# Patient Record
Sex: Female | Born: 1965 | Race: White | Hispanic: No | Marital: Married | Smoking: Former smoker
Health system: Southern US, Community
[De-identification: ages and names within clinical notes are randomized; demographics above are authoritative.]

## PROBLEM LIST (undated history)

## (undated) DIAGNOSIS — I10 Essential (primary) hypertension: Secondary | ICD-10-CM

## (undated) DIAGNOSIS — I4891 Unspecified atrial fibrillation: Secondary | ICD-10-CM

## (undated) HISTORY — PX: TONSILLECTOMY: SUR1361

---

## 2004-01-30 ENCOUNTER — Ambulatory Visit: Payer: Self-pay | Admitting: Unknown Physician Specialty

## 2006-08-18 ENCOUNTER — Ambulatory Visit: Payer: Self-pay | Admitting: Unknown Physician Specialty

## 2007-02-17 ENCOUNTER — Ambulatory Visit: Payer: Self-pay | Admitting: Psychiatry

## 2007-02-28 ENCOUNTER — Ambulatory Visit: Payer: Self-pay | Admitting: Psychiatry

## 2007-03-09 ENCOUNTER — Ambulatory Visit: Payer: Self-pay | Admitting: Psychiatry

## 2007-03-29 ENCOUNTER — Ambulatory Visit: Payer: Self-pay | Admitting: Psychiatry

## 2007-04-19 ENCOUNTER — Ambulatory Visit: Payer: Self-pay | Admitting: Gastroenterology

## 2007-06-09 ENCOUNTER — Ambulatory Visit: Payer: Self-pay | Admitting: General Practice

## 2007-09-27 ENCOUNTER — Ambulatory Visit: Payer: Self-pay | Admitting: Unknown Physician Specialty

## 2008-10-03 ENCOUNTER — Ambulatory Visit: Payer: Self-pay | Admitting: Unknown Physician Specialty

## 2013-05-26 ENCOUNTER — Ambulatory Visit: Payer: Self-pay

## 2013-05-26 LAB — RAPID STREP-A WITH REFLX: Micro Text Report: NEGATIVE

## 2013-05-29 LAB — BETA STREP CULTURE(ARMC)

## 2013-11-03 ENCOUNTER — Ambulatory Visit: Payer: Self-pay | Admitting: Gastroenterology

## 2014-05-03 ENCOUNTER — Ambulatory Visit: Payer: Self-pay | Admitting: Internal Medicine

## 2014-10-25 ENCOUNTER — Other Ambulatory Visit: Payer: Self-pay | Admitting: Internal Medicine

## 2014-10-25 DIAGNOSIS — R748 Abnormal levels of other serum enzymes: Secondary | ICD-10-CM

## 2014-10-29 ENCOUNTER — Ambulatory Visit
Admission: RE | Admit: 2014-10-29 | Discharge: 2014-10-29 | Disposition: A | Discharge status: Home / Self Care | Payer: BC Managed Care – PPO | Source: Ambulatory Visit | Attending: Internal Medicine | Admitting: Internal Medicine

## 2014-10-29 DIAGNOSIS — R748 Abnormal levels of other serum enzymes: Secondary | ICD-10-CM | POA: Insufficient documentation

## 2015-07-03 ENCOUNTER — Other Ambulatory Visit: Payer: Self-pay | Admitting: Internal Medicine

## 2015-07-03 DIAGNOSIS — R748 Abnormal levels of other serum enzymes: Secondary | ICD-10-CM

## 2015-07-03 DIAGNOSIS — R17 Unspecified jaundice: Secondary | ICD-10-CM

## 2015-07-04 ENCOUNTER — Other Ambulatory Visit: Payer: Self-pay | Admitting: Internal Medicine

## 2015-07-04 ENCOUNTER — Ambulatory Visit
Admission: RE | Admit: 2015-07-04 | Discharge: 2015-07-04 | Disposition: A | Discharge status: Home / Self Care | Payer: BC Managed Care – PPO | Source: Ambulatory Visit | Attending: Internal Medicine | Admitting: Internal Medicine

## 2015-07-04 DIAGNOSIS — R748 Abnormal levels of other serum enzymes: Secondary | ICD-10-CM | POA: Diagnosis not present

## 2015-07-04 DIAGNOSIS — R17 Unspecified jaundice: Secondary | ICD-10-CM

## 2015-07-11 ENCOUNTER — Ambulatory Visit: Payer: BC Managed Care – PPO

## 2015-07-18 ENCOUNTER — Inpatient Hospital Stay: Payer: BC Managed Care – PPO

## 2015-07-18 ENCOUNTER — Encounter: Payer: Self-pay | Admitting: *Deleted

## 2015-07-18 ENCOUNTER — Inpatient Hospital Stay
Admission: AD | Admit: 2015-07-18 | Discharge: 2015-07-19 | DRG: 441 | Disposition: A | Discharge status: Home / Self Care | Payer: BC Managed Care – PPO | Source: Ambulatory Visit | Attending: Internal Medicine | Admitting: Internal Medicine

## 2015-07-18 DIAGNOSIS — Z6820 Body mass index (BMI) 20.0-20.9, adult: Secondary | ICD-10-CM

## 2015-07-18 DIAGNOSIS — R109 Unspecified abdominal pain: Secondary | ICD-10-CM | POA: Diagnosis present

## 2015-07-18 DIAGNOSIS — E785 Hyperlipidemia, unspecified: Secondary | ICD-10-CM | POA: Diagnosis present

## 2015-07-18 DIAGNOSIS — E43 Unspecified severe protein-calorie malnutrition: Secondary | ICD-10-CM | POA: Insufficient documentation

## 2015-07-18 DIAGNOSIS — Z91048 Other nonmedicinal substance allergy status: Secondary | ICD-10-CM

## 2015-07-18 DIAGNOSIS — K7581 Nonalcoholic steatohepatitis (NASH): Principal | ICD-10-CM | POA: Diagnosis present

## 2015-07-18 DIAGNOSIS — Z7982 Long term (current) use of aspirin: Secondary | ICD-10-CM | POA: Diagnosis not present

## 2015-07-18 DIAGNOSIS — R627 Adult failure to thrive: Secondary | ICD-10-CM | POA: Diagnosis present

## 2015-07-18 DIAGNOSIS — R7989 Other specified abnormal findings of blood chemistry: Secondary | ICD-10-CM

## 2015-07-18 DIAGNOSIS — Z8249 Family history of ischemic heart disease and other diseases of the circulatory system: Secondary | ICD-10-CM

## 2015-07-18 DIAGNOSIS — R945 Abnormal results of liver function studies: Secondary | ICD-10-CM

## 2015-07-18 DIAGNOSIS — Z87891 Personal history of nicotine dependence: Secondary | ICD-10-CM

## 2015-07-18 DIAGNOSIS — K219 Gastro-esophageal reflux disease without esophagitis: Secondary | ICD-10-CM | POA: Diagnosis present

## 2015-07-18 DIAGNOSIS — I10 Essential (primary) hypertension: Secondary | ICD-10-CM | POA: Diagnosis present

## 2015-07-18 DIAGNOSIS — I480 Paroxysmal atrial fibrillation: Secondary | ICD-10-CM | POA: Diagnosis present

## 2015-07-18 DIAGNOSIS — R112 Nausea with vomiting, unspecified: Secondary | ICD-10-CM | POA: Diagnosis present

## 2015-07-18 HISTORY — DX: Unspecified atrial fibrillation: I48.91

## 2015-07-18 HISTORY — DX: Essential (primary) hypertension: I10

## 2015-07-18 LAB — CBC WITH DIFFERENTIAL/PLATELET
BASOS PCT: 2 %
Basophils Absolute: 0.2 10*3/uL — ABNORMAL HIGH (ref 0–0.1)
EOS ABS: 0 10*3/uL (ref 0–0.7)
Eosinophils Relative: 0 %
HCT: 34.4 % — ABNORMAL LOW (ref 35.0–47.0)
HEMOGLOBIN: 12 g/dL (ref 12.0–16.0)
Lymphocytes Relative: 9 %
Lymphs Abs: 1.3 10*3/uL (ref 1.0–3.6)
MCH: 37.4 pg — ABNORMAL HIGH (ref 26.0–34.0)
MCHC: 34.8 g/dL (ref 32.0–36.0)
MCV: 107.6 fL — ABNORMAL HIGH (ref 80.0–100.0)
Monocytes Absolute: 0.8 10*3/uL (ref 0.2–0.9)
Monocytes Relative: 6 %
NEUTROS PCT: 83 %
Neutro Abs: 11.9 10*3/uL — ABNORMAL HIGH (ref 1.4–6.5)
Platelets: 272 10*3/uL (ref 150–440)
RBC: 3.19 MIL/uL — AB (ref 3.80–5.20)
RDW: 14 % (ref 11.5–14.5)
WBC: 14.2 10*3/uL — AB (ref 3.6–11.0)

## 2015-07-18 LAB — LIPASE, BLOOD: Lipase: 36 U/L (ref 11–51)

## 2015-07-18 LAB — COMPREHENSIVE METABOLIC PANEL
ALBUMIN: 2.4 g/dL — AB (ref 3.5–5.0)
ALK PHOS: 356 U/L — AB (ref 38–126)
ALT: 70 U/L — AB (ref 14–54)
AST: 212 U/L — ABNORMAL HIGH (ref 15–41)
Anion gap: 11 (ref 5–15)
BUN: <5 mg/dL — ABNORMAL LOW (ref 6–20)
CALCIUM: 8.1 mg/dL — AB (ref 8.9–10.3)
CO2: 35 mmol/L — AB (ref 22–32)
CREATININE: 0.73 mg/dL (ref 0.44–1.00)
Chloride: 89 mmol/L — ABNORMAL LOW (ref 101–111)
GFR calc Af Amer: >60 mL/min (ref >60)
GFR calc non Af Amer: >60 mL/min (ref >60)
GLUCOSE: 99 mg/dL (ref 65–99)
Potassium: 2.8 mmol/L — CL (ref 3.5–5.1)
SODIUM: 135 mmol/L (ref 135–145)
Total Bilirubin: 4.9 mg/dL — ABNORMAL HIGH (ref 0.3–1.2)
Total Protein: 5.9 g/dL — ABNORMAL LOW (ref 6.5–8.1)

## 2015-07-18 LAB — POTASSIUM: POTASSIUM: 2.5 mmol/L — AB (ref 3.5–5.1)

## 2015-07-18 LAB — FERRITIN: FERRITIN: 555 ng/mL — AB (ref 11–307)

## 2015-07-18 LAB — AMYLASE: Amylase: 48 U/L (ref 28–100)

## 2015-07-18 LAB — MAGNESIUM: MAGNESIUM: 1.5 mg/dL — AB (ref 1.7–2.4)

## 2015-07-18 MED ORDER — ENOXAPARIN SODIUM 40 MG/0.4ML ~~LOC~~ SOLN
40.0000 mg | SUBCUTANEOUS | Status: DC
  Administered 2015-07-18: 40 mg via SUBCUTANEOUS
  Filled 2015-07-18: qty 0.4

## 2015-07-18 MED ORDER — PANTOPRAZOLE SODIUM 40 MG IV SOLR
40.0000 mg | Freq: Every day | INTRAVENOUS | Status: DC
  Administered 2015-07-18 – 2015-07-19 (×2): 40 mg via INTRAVENOUS
  Filled 2015-07-18 (×2): qty 40

## 2015-07-18 MED ORDER — POTASSIUM CHLORIDE 10 MEQ/100ML IV SOLN
10.0000 meq | INTRAVENOUS | Status: DC
  Administered 2015-07-18: 10 meq via INTRAVENOUS
  Filled 2015-07-18 (×4): qty 100

## 2015-07-18 MED ORDER — MAGNESIUM SULFATE 4 GM/100ML IV SOLN
4.0000 g | Freq: Once | INTRAVENOUS | Status: AC
  Administered 2015-07-18: 4 g via INTRAVENOUS
  Filled 2015-07-18: qty 100

## 2015-07-18 MED ORDER — ONDANSETRON HCL 4 MG/2ML IJ SOLN
4.0000 mg | Freq: Four times a day (QID) | INTRAMUSCULAR | Status: DC | PRN
  Administered 2015-07-18: 4 mg via INTRAVENOUS
  Filled 2015-07-18: qty 2

## 2015-07-18 MED ORDER — ONDANSETRON HCL 4 MG PO TABS: 4.0000 mg | ORAL_TABLET | Freq: Four times a day (QID) | ORAL | Status: DC | PRN

## 2015-07-18 MED ORDER — DIPHENHYDRAMINE HCL 25 MG PO CAPS
25.0000 mg | ORAL_CAPSULE | Freq: Once | ORAL | Status: AC
  Administered 2015-07-18: 22:00:00 25 mg via ORAL
  Filled 2015-07-18: qty 1

## 2015-07-18 MED ORDER — POTASSIUM CHLORIDE CRYS ER 20 MEQ PO TBCR
40.0000 meq | EXTENDED_RELEASE_TABLET | ORAL | Status: AC
  Administered 2015-07-18 – 2015-07-19 (×2): 40 meq via ORAL
  Filled 2015-07-18 (×2): qty 2

## 2015-07-18 MED ORDER — POTASSIUM CHLORIDE CRYS ER 20 MEQ PO TBCR
40.0000 meq | EXTENDED_RELEASE_TABLET | Freq: Once | ORAL | Status: AC
  Administered 2015-07-18: 20:00:00 40 meq via ORAL
  Filled 2015-07-18: qty 2

## 2015-07-18 MED ORDER — KCL IN DEXTROSE-NACL 20-5-0.9 MEQ/L-%-% IV SOLN
INTRAVENOUS | Status: AC
  Administered 2015-07-18 – 2015-07-19 (×2): via INTRAVENOUS
  Filled 2015-07-18 (×2): qty 1000

## 2015-07-18 MED ORDER — METOPROLOL TARTRATE 1 MG/ML IV SOLN: 5.0000 mg | INTRAVENOUS | Status: DC | PRN

## 2015-07-18 MED ORDER — GADOBENATE DIMEGLUMINE 529 MG/ML IV SOLN
15.0000 mL | Freq: Once | INTRAVENOUS | Status: AC | PRN
  Administered 2015-07-18: 20:00:00 15 mL via INTRAVENOUS

## 2015-07-18 MED ORDER — MORPHINE SULFATE (PF) 2 MG/ML IV SOLN
2.0000 mg | INTRAVENOUS | Status: DC | PRN
  Administered 2015-07-18 (×2): 2 mg via INTRAVENOUS
  Filled 2015-07-18 (×2): qty 1

## 2015-07-18 NOTE — Consult Note (Signed)
  Pt seen and examined. Please see Monique Mcfarland' notes. Pt with jaundice, N/V/D, and some abdominal pain. U/S neg. No obvious risk factors. MRCP ordered to make sure there is no biliary obstruction. If MRCP positive, then ERCP tomorrow. Otherwise, will check for hepatocellular process. Labs ordered. Will follow. Thanks.

## 2015-07-18 NOTE — Progress Notes (Signed)
Dr. Amado Coegouru notified of critical potassium at 2.8. Telephone

## 2015-07-18 NOTE — Consult Note (Signed)
GI Inpatient Consult Note  Reason for Consult:   Attending Requesting Consult:  History of Present Illness: Monique Mcfarland is a 50 y.o. female seen for evaluation of abnormal LFTs at the request of Dr. Amado CoeGouru.  She reports developing nausea, vomiting, weigh loss early Feb 2017 when caring for her terminally ill mother who passed in March. She reports post prandial nausea, vomiting, and epigastric pain. Symptoms can occur 15 min-2 hours after meals. Usually having vomiting of non digested foods and bile. After vomiting the epigastric pain and nausea will typically resolve. She takes omeprazole 20mg  daily which controls her GERD symptoms fairly well. She denies any dysphagia. BM are QOD without gross blood or melena. Weight down approximately 25lbs in last 8 weeks. Family history of gallstones. Nsaid use less than 1x/week. No lower abd pain.   Last Colonoscopy: 10/2013-normal.  Last Endoscopy: None prior    Past Medical History:  Past Medical History  Diagnosis Date  . Hypertension   . Atrial fibrillation [facility](HCC)     Problem List: Patient Active Problem List   Diagnosis Date Noted  . Acute abdominal pain 07/18/2015    Past Surgical History: Past Surgical History  Procedure Laterality Date  . Tonsillectomy      Allergies: Allergies  Allergen Reactions  . Pollen Extract Other (See Comments)    Swelling of eyes, running nose, stretchy throat, pet dandruff    Home Medications: Prescriptions prior to admission  Medication Sig Dispense Refill Last Dose  . metoprolol tartrate (LOPRESSOR) 25 MG tablet Take 25 mg by mouth daily.     . Multiple Vitamin (MULTIVITAMIN) tablet Take 1 tablet by mouth daily.     Marland Kitchen. omeprazole (PRILOSEC) 10 MG capsule Take 10 mg by mouth daily.      Home medication reconciliation was completed with the patient.   Scheduled Inpatient Medications:   . enoxaparin (LOVENOX) injection  40 mg Subcutaneous Q24H    Continuous Inpatient Infusions:   .  dextrose 5 % and 0.9 % NaCl with KCl 20 mEq/L      PRN Inpatient Medications:  metoprolol, morphine injection, ondansetron **OR** ondansetron (ZOFRAN) IV  Family History: family history includes Atrial fibrillation in her father; Cancer in her maternal grandfather, maternal grandmother, and mother; Heart attack in her father and paternal grandmother; Hypertension in her father.  Gallstones-mother.  Social History:   reports that she quit smoking about 16 months ago. Her smoking use included Cigarettes. She has a 7.5 pack-year smoking history. She does not have any smokeless tobacco history on file. She reports that she drinks about 0.6 oz of alcohol per week. She reports that she does not use illicit drugs.   2-3 alcohol drinks per week. No illicit drug use.   Review of Systems: Constitutional: + weight loss.  Eyes: No changes in vision. ENT: No oral lesions, sore throat.  GI: see HPI.  Heme/Lymph: No easy bruising.  CV: No chest pain.  GU: No hematuria.  Integumentary: No rashes.  Neuro: No headaches.  Psych: No depression/anxiety.  Endocrine: No heat/cold intolerance.  Allergic/Immunologic: No urticaria.  Resp: No cough, SOB.  Musculoskeletal: No joint swelling.    Physical Examination: BP 121/77 mmHg  Pulse 92  Temp(Src) 98 F (36.7 C) (Oral)  Resp 18  Ht 5\' 9"  (1.753 m)  Wt 137 lb 6.4 oz (62.324 kg)  BMI 20.28 kg/m2  SpO2 100% Gen: NAD, alert and oriented x 4 HEENT: PEERLA, EOMI, Neck: supple, no JVD or thyromegaly Chest: CTA bilaterally,  no wheezes, crackles, or other adventitious sounds CV: RRR, no m/g/c/r Abd: soft, NT, ND, +BS in all four quadrants; no HSM, guarding, ridigity, or rebound tenderness Ext: no edema, well perfused with 2+ pulses, Skin: no rash or lesions noted Lymph: no LAD  Data: Lab Results  Component Value Date   WBC 14.2* 07/18/2015   HGB 12.0 07/18/2015   HCT 34.4* 07/18/2015   MCV 107.6* 07/18/2015   PLT 272 07/18/2015    Recent  Labs Lab 07/18/15 1410  HGB 12.0   No results found for: NA, K, CL, CO2, BUN, CREATININE, GLU No results found for: ALT, AST, GGT, ALKPHOS, BILITOT No results for input(s): APTT, INR, PTT in the last 168 hours.   SU IMPRESSION: Increased liver echogenicity, a finding most likely indicative of hepatic steatosis. While no focal liver lesions are identified, it must be cautioned that the sensitivity of ultrasound for focal liver lesions is diminished in this circumstance. Study otherwise Unremarkable.   Assessment/Plan: [name] is a 50 y.o. female admitted for n/v, abdominal pain, abnormal LFTS, weight loss.    1. Abnormal LFTs/n/v/abd pain-minimally elevated 03/2014, transaminases have been significantly elevated since 09/2014, T bili began elevating in 06/2015. Negative viral serologies. Needs MRCP to rule out obstructive process although none seen on [place]. Will check labs for autoimmune hepatitis, Wilson's, hemachromatosis, etc. If MRCP positive will need ERCP.     Recommendations:  1. MRCP 2. Full serologic work up.  3. Clear liquid diet after MRPC, NPO at midnight for any possible procedures tomorrow.   *Case discussed w/ Dr. Bluford Kaufmann.   Thank you for the consult. Please call with questions or concerns.  Bluford Kaufmann, PA-C [facility] GI

## 2015-07-18 NOTE — H&P (Signed)
[facility] - [place] at [facility]   PATIENT NAME: Monique Mcfarland    MR#:  [id number]  DATE OF BIRTH:  October 28, 1965  DATE OF ADMISSION:  07/18/2015  PRIMARY CARE PHYSICIAN: Curtis Sites III, MD   REQUESTING/REFERRING PHYSICIAN: Daniel Nones  CHIEF COMPLAINT:  Abdominal pain, nausea, vomiting and weight loss  HISTORY OF PRESENT ILLNESS:  Monique Mcfarland  is a 50 y.o. female with a known history of essential hypertension, hyperlipidemia and paroxysmal atrial fibrillation on aspirin is presenting as a direct admit from primary care physician office with a chief complaint of intractable nausea and vomiting and lower abdominal discomfort associated with 25 pounds of weight loss in the past 6-8 weeks. Patient was seen by her primary care physician few weeks ago and as her LFTs are elevated right upper quadrant ultrasound was performed on 4/6 in which has revealed hepatic steatosis at that time his bilirubin was elevated at 3.8 and repeat her LFTs has revealed bilirubin at file as reported by the primary care physician. In the interim patient became nauseous and has been persistently vomiting for the past few days and reporting 25 pounds of weight loss in the past 6-8 weeks. PCP is concerned and sent her here as a direct admit for further evaluation by gastroenterology. During my examination patient is still complaining of nausea and vomiting with the lower abdominal discomfort associated with weakness. Denies any dizziness or loss of consciousness. Reports that her hepatitis panel is normal  PAST MEDICAL HISTORY:   Past Medical History  Diagnosis Date  . Hypertension   . Atrial fibrillation (HCC)    hyperlipidemia  PAST SURGICAL HISTOIRY:   Past Surgical History  Procedure Laterality Date  . Tonsillectomy      SOCIAL HISTORY:   Social History  Substance Use Topics  . Smoking status: Former Smoker -- 0.50 packs/day for 15 years    Types: Cigarettes    Quit  date: 02/22/2014  . Smokeless tobacco: Not on file  . Alcohol Use: 0.6 oz/week    1 Shots of liquor per week   lives with her partner and their son-  FAMILY HISTORY:   Family History  Problem Relation Age of Onset  . Cancer Mother   . Hypertension Father   . Atrial fibrillation Father   . Heart attack Father   . Cancer Maternal Grandmother   . Cancer Maternal Grandfather   . Heart attack Paternal Grandmother   Mom deceased with the static lung cancer  DRUG ALLERGIES:   Allergies  Allergen Reactions  . Pollen Extract Other (See Comments)    Swelling of eyes, running nose, stretchy throat, pet dandruff    REVIEW OF SYSTEMS:  CONSTITUTIONAL: No fever, fatigue or weakness.  EYES: No blurred or double vision.  EARS, NOSE, AND THROAT: No tinnitus or ear pain.  RESPIRATORY: No cough, shortness of breath, wheezing or hemoptysis.  CARDIOVASCULAR: No chest pain, orthopnea, edema.  GASTROINTESTINAL: Reporting  nausea, vomiting,denies  diarrhea , reporting lower abdominal discomfort with some pain  GENITOURINARY: No dysuria, hematuria.  ENDOCRINE: No polyuria, nocturia,  HEMATOLOGY: No anemia, easy bruising or bleeding SKIN: No rash or lesion. MUSCULOSKELETAL: No joint pain or arthritis.   NEUROLOGIC: No tingling, numbness, weakness.  PSYCHIATRY: No anxiety or depression.   MEDICATIONS AT HOME:   Prior to Admission medications   Medication Sig Start Date End Date Taking? Authorizing Provider  metoprolol tartrate (LOPRESSOR) 25 MG tablet Take 25 mg by mouth daily.   Yes  Historical Provider, MD  Multiple Vitamin (MULTIVITAMIN) tablet Take 1 tablet by mouth daily.   Yes Historical Provider, MD  omeprazole (PRILOSEC) 10 MG capsule Take 10 mg by mouth daily.   Yes Historical Provider, MD      VITAL SIGNS:  Blood pressure 121/77, pulse 92, temperature 98 F (36.7 C), temperature source Oral, resp. rate 18, height  (1.753 m), weight 62.324 kg (137 lb 6.4 oz), SpO2 100  %.  PHYSICAL EXAMINATION:  GENERAL:  50 y.o.-year-old patient lying in the bed with no acute distress.  EYES: Pupils equal, round, reactive to light and accommodation. No scleral icterus. Extraocular muscles intact.  HEENT: Head atraumatic, normocephalic. Dry mucous membranes  NECK:  Supple, no jugular venous distention. No thyroid enlargement, no tenderness.  LUNGS: Normal breath sounds bilaterally, no wheezing, rales,rhonchi or crepitation. No use of accessory muscles of respiration.  CARDIOVASCULAR: S1, S2 normal. No murmurs, rubs, or gallops.  ABDOMEN: Soft, with a minimal lower abdominal tenderness with no rebound tenderness, nondistended. Bowel sounds present.   EXTREMITIES: No pedal edema, cyanosis, or clubbing.  NEUROLOGIC: Cranial nerves II through XII are intact. Muscle strength 5/5 in all extremities. Sensation intact. Gait not checked.  PSYCHIATRIC: The patient is alert and oriented x 3.  SKIN: No obvious rash, lesion, or ulcer.   LABORATORY PANEL:   CBC  Recent Labs Lab 07/18/15 1410  WBC 14.2*  HGB 12.0  HCT 34.4*  PLT 272   ------------------------------------------------------------------------------------------------------------------  Chemistries  No results for input(s): NA, K, CL, CO2, GLUCOSE, BUN, CREATININE, CALCIUM, MG, AST, ALT, ALKPHOS, BILITOT in the last 168 hours.  Invalid input(s): GFRCGP ------------------------------------------------------------------------------------------------------------------  Cardiac Enzymes No results for input(s): TROPONINI in the last 168 hours. ------------------------------------------------------------------------------------------------------------------  RADIOLOGY:  No results found.  EKG:  No orders found for this or any previous visit.  IMPRESSION AND PLAN:   Monique Mcfarland  is a 50 y.o. female with a known history of essential hypertension, hyperlipidemia and paroxysmal atrial fibrillation on aspirin  is presenting as a direct admit from primary care physician office with a chief complaint of intractable nausea and vomiting and lower abdominal discomfort associated with 25 pounds of weight loss in the past 6-8 weeks. Patient was seen by her primary care physician few weeks ago and repeat bilirubin is elevated and sent over to the hospital as a direct admit  # Intractable nausea and vomiting with acute lower abdominal pain Will keep her nothing by mouth with ice chips IV fluids Antibiotics and symptomatic management with pain medications when necessary  #Elevated LFTs with weight loss Hepatitis panel was checked by the PCP in July 2016 which was normal Right upper quadrant ultrasound has revealed hepatic steatosis Elevated bilirubin and LFTs is concerning for some obstructive pathology versus underlying mass, we will order MRCP Stat CBC and CMP are ordered which are pending at this time. GI consult is placed and discussed with Dr. Val Eagle who will see the patient and might do ERCP in a.m. depending on the MRCP result  #Essential hypertension Currently patient is nothing by mouth Will provide Lopressor IV as needed basis and continue IV fluids   #failure to thrive with 25 pounds weight loss This could be from intractable nausea and vomiting for the past 6-8 weeks We will get MRCP to rule out any underlying mass   Provide GI prophylaxis and DVT prophylaxis  Greater than 50% time was spent on face-to-face counseling, education and coronation of care  All the records are reviewed and  case discussed with ED provider. Management plans discussed with the patient, and she is  in agreement.  CODE STATUS: fc/ Wife LORI HCPOA  TOTAL TIME TAKING CARE OF THIS PATIENT: 45 minutes.    Ramonita LabGouru, Jaevin Medearis M.D on 07/18/2015 at 2:42 PM  Between 7am to 6pm - Pager - [phone]  After 6pm go to [email or web address] - password [email or web address] [facility]  [facility]  Office  [phone]  CC: Primary care  physician; Lynnea FerrierBERT J KLEIN III, MD

## 2015-07-19 DIAGNOSIS — E43 Unspecified severe protein-calorie malnutrition: Secondary | ICD-10-CM | POA: Insufficient documentation

## 2015-07-19 LAB — CBC
HCT: 30.9 % — ABNORMAL LOW (ref 35.0–47.0)
HEMOGLOBIN: 10.8 g/dL — AB (ref 12.0–16.0)
MCH: 37.7 pg — AB (ref 26.0–34.0)
MCHC: 34.8 g/dL (ref 32.0–36.0)
MCV: 108.1 fL — AB (ref 80.0–100.0)
Platelets: 229 10*3/uL (ref 150–440)
RBC: 2.86 MIL/uL — AB (ref 3.80–5.20)
RDW: 14.6 % — ABNORMAL HIGH (ref 11.5–14.5)
WBC: 12 10*3/uL — ABNORMAL HIGH (ref 3.6–11.0)

## 2015-07-19 LAB — COMPREHENSIVE METABOLIC PANEL
ALBUMIN: 2.1 g/dL — AB (ref 3.5–5.0)
ALT: 59 U/L — AB (ref 14–54)
ANION GAP: 7 (ref 5–15)
AST: 171 U/L — ABNORMAL HIGH (ref 15–41)
Alkaline Phosphatase: 301 U/L — ABNORMAL HIGH (ref 38–126)
BUN: <5 mg/dL — ABNORMAL LOW (ref 6–20)
CHLORIDE: 96 mmol/L — AB (ref 101–111)
CO2: 32 mmol/L (ref 22–32)
Calcium: 7.6 mg/dL — ABNORMAL LOW (ref 8.9–10.3)
Creatinine, Ser: 0.66 mg/dL (ref 0.44–1.00)
GFR calc non Af Amer: >60 mL/min (ref >60)
GLUCOSE: 122 mg/dL — AB (ref 65–99)
Potassium: 3.3 mmol/L — ABNORMAL LOW (ref 3.5–5.1)
SODIUM: 135 mmol/L (ref 135–145)
Total Bilirubin: 4.1 mg/dL — ABNORMAL HIGH (ref 0.3–1.2)
Total Protein: 5.2 g/dL — ABNORMAL LOW (ref 6.5–8.1)

## 2015-07-19 LAB — CERULOPLASMIN: Ceruloplasmin: 24.6 mg/dL (ref 19.0–39.0)

## 2015-07-19 LAB — PROTIME-INR
INR: 1.36
Prothrombin Time: 16.9 seconds — ABNORMAL HIGH (ref 11.4–15.0)

## 2015-07-19 LAB — MITOCHONDRIAL ANTIBODIES: Mitochondrial M2 Ab, IgG: 8.7 Units (ref 0.0–20.0)

## 2015-07-19 LAB — MAGNESIUM: Magnesium: 2.3 mg/dL (ref 1.7–2.4)

## 2015-07-19 LAB — ANTI-SMOOTH MUSCLE ANTIBODY, IGG: F-ACTIN AB IGG: 18 U (ref 0–19)

## 2015-07-19 LAB — ANTINUCLEAR ANTIBODIES, IFA: ANTINUCLEAR ANTIBODIES, IFA: NEGATIVE

## 2015-07-19 MED ORDER — TRAMADOL HCL 50 MG PO TABS: 50.0000 mg | ORAL_TABLET | Freq: Three times a day (TID) | ORAL | Status: DC | PRN

## 2015-07-19 MED ORDER — ENSURE ENLIVE PO LIQD: 237.0000 mL | Freq: Two times a day (BID) | ORAL | Status: DC

## 2015-07-19 MED ORDER — MAGNESIUM OXIDE 400 (241.3 MG) MG PO TABS: 400.0000 mg | ORAL_TABLET | Freq: Two times a day (BID) | ORAL | Status: DC

## 2015-07-19 MED ORDER — MAGNESIUM OXIDE 400 (241.3 MG) MG PO TABS: 400.0000 mg | ORAL_TABLET | Freq: Two times a day (BID) | ORAL | Status: AC

## 2015-07-19 MED ORDER — ONDANSETRON 4 MG PO TBDP: 4.0000 mg | ORAL_TABLET | Freq: Three times a day (TID) | ORAL | Status: AC | PRN

## 2015-07-19 MED ORDER — ONDANSETRON 4 MG PO TBDP: 4.0000 mg | ORAL_TABLET | Freq: Three times a day (TID) | ORAL | Status: DC | PRN

## 2015-07-19 NOTE — Progress Notes (Addendum)
Patient is alert and oriented and alert, denies pain, no n/v, 1 bm throughout shift, wife at bedside, pt is d/c to home, recommended to f/u with GI in 2 weeks, appt scheduled for GI and PCP. Rx and note provided to patient. On magnesium replacement for hypomagnesium. Pt advised to stay until late today when able to tolerate solid diet but patient wishes to go home and eat at home, tolerating full liquid diet. Nursing staff offered to push patient out in wheelchair but patient request to walk out with wife.

## 2015-07-19 NOTE — Progress Notes (Signed)
Patient ID: Monique Mcfarland, female   DOB: 02/06/1966, 50 y.o.   MRN: [id number]                                          [facility]    Monique Mcfarland was admitted to the Hospital on 07/18/2015 and Discharged  07/19/2015 and should be excused from work/school   for 3 days starting 07/18/2015 , may return to work/school without any restrictions.  Call Monique FinnerSona Bon Dowis MD, [facility]  [phone] with questions.  Monique Mcfarland M.D on 07/19/2015,at 1:36 PM

## 2015-07-19 NOTE — Consult Note (Signed)
  GI Inpatient Follow-up Note  Patient Identification: Jamison NeighborRobin A Lackey is a 50 y.o. female  Subjective: Overall feeling better. No abdominal pain. Less nausea or diarrhea. About 25 lb wt loss in 2 months. No family hx of liver disease. Hepatitis panel neg in office. MRCP showed steatohepatitis. No biliary obstruction. No new meds or OTC meds/herbs, etc. Does admit to 1/2 dozen drinks per week.  Scheduled Inpatient Medications:  . enoxaparin (LOVENOX) injection  40 mg Subcutaneous Q24H  . feeding supplement (ENSURE ENLIVE)  237 mL Oral BID BM  . pantoprazole (PROTONIX) IV  40 mg Intravenous Daily    Continuous Inpatient Infusions:     PRN Inpatient Medications:  metoprolol, morphine injection, ondansetron **OR** ondansetron (ZOFRAN) IV  Review of Systems: Constitutional: Weight is stable.  Eyes: No changes in vision. ENT: No oral lesions, sore throat.  GI: see HPI.  Heme/Lymph: No easy bruising.  CV: No chest pain.  GU: No hematuria.  Integumentary: No rashes.  Neuro: No headaches.  Psych: No depression/anxiety.  Endocrine: No heat/cold intolerance.  Allergic/Immunologic: No urticaria.  Resp: No cough, SOB.  Musculoskeletal: No joint swelling.    Physical Examination: BP 97/60 mmHg  Pulse 93  Temp(Src) 97.9 F (36.6 C) (Oral)  Resp 16  Ht 5\' 9"  (1.753 m)  Wt 137 lb 6.4 oz (62.324 kg)  BMI 20.28 kg/m2  SpO2 96% Gen: NAD, alert and oriented x 4 HEENT: PEERLA, EOMI, Neck: supple, no JVD or thyromegaly Chest: CTA bilaterally, no wheezes, crackles, or other adventitious sounds CV: RRR, no m/g/c/r Abd: soft, NT, ND, +BS in all four quadrants; no HSM, guarding, ridigity, or rebound tenderness Ext: no edema, well perfused with 2+ pulses, Skin: no rash or lesions noted Lymph: no LAD  Data: Lab Results  Component Value Date   WBC 12.0* 07/19/2015   HGB 10.8* 07/19/2015   HCT 30.9* 07/19/2015   MCV 108.1* 07/19/2015   PLT 229 07/19/2015    Recent Labs Lab  07/18/15 1410 07/19/15 0609  HGB 12.0 10.8*   Lab Results  Component Value Date   NA 135 07/19/2015   K 3.3* 07/19/2015   CL 96* 07/19/2015   CO2 32 07/19/2015   BUN <5* 07/19/2015   CREATININE 0.66 07/19/2015   Lab Results  Component Value Date   ALT 59* 07/19/2015   AST 171* 07/19/2015   ALKPHOS 301* 07/19/2015   BILITOT 4.1* 07/19/2015    Recent Labs Lab 07/19/15 0609  INR 1.36   Assessment/Plan: Ms. Gaylord ShihHarlukowicz is a 50 y.o. female with cholestatic jaundice and steatohepatitis on MRCP.  Recommendations: Advance diet as tolerated. Various liver serologies sent. Abstain from EtOH. Continue to moniter LFT. If can tolerate solids, pt can be discharged. Call GI on call if questions. Otherwise, pt can f/u with us later as outpatient. Thanks. Please call with questions or concerns.  Usbaldo Pannone, Ezzard StandingPAUL Y, MD

## 2015-07-19 NOTE — Progress Notes (Signed)
Initial Nutrition Assessment  DOCUMENTATION CODES:   Severe malnutrition in context of acute illness/injury  INTERVENTION:   -Cater to pt preferences -Await diet progression as medically able -Recommend Ensure Enlive po BID, each supplement provides 350 kcal and 20 grams of protein and will send [employer] on meal trays for pt to try   NUTRITION DIAGNOSIS:   Malnutrition related to acute illness as evidenced by percent weight loss, energy intake < or equal to 50% for > or equal to 5 days.   GOAL:   Patient will meet greater than or equal to 90% of their needs  MONITOR:   PO intake, Supplement acceptance, Diet advancement, Labs, Weight trends, I & O's  REASON FOR ASSESSMENT:   Consult Assessment of nutrition requirement/status  ASSESSMENT:   Pt admitted with postprandial n/v and abdominal pain with elevated liver enzymes sent as direct admit from PCP. Pt c/o 25lbs weight loss in 6-8 weeks which started in February taking care of terminally ill mother per GI note. Pt s/p MRCP.  Past Medical History  Diagnosis Date  . Hypertension   . Atrial fibrillation ([place])    Diet Order:  Diet full liquid Room service appropriate?: Yes; Fluid consistency:: Thin   Pt reports poor po intake secondary to postprandial emesis after eating solids and liquids for weeks PTA.   Pt reports while taking care of her mother on chemotherapy she was fixing a lot of comfort foods and she was eating lots of those foods as well. Pt then reports as her mother became more ill and she was taking care of late February her appetite and nausea started to become more and more frequent. Pt reports thinking it was stress of caring for mother at first. Mother passed in March and pt reports thinking her appetite and po intake would improve however een since pt reports vomitting everything back up.  Pt reports even vomitting liquids and water for the past recent weeks. Pt reports feeling so hungry but  nothing would stay down that she tried. Pt reports only if she sipped something very slowly would it stay down for the past week.  Pt reports trying a Boost but she vomitted it back soon after. Pt reports a long time ago drinking whey protein and tolerating well.   Gastrointestinal Profile: no nausea this morning per pt Last BM:  07/17/2015  Medications: Protonix Labs: K 3.3, Mg 1.5 yesterday  Hepatic Function Latest Ref Rng 07/19/2015 07/18/2015  Total Protein 6.5 - 8.1 g/dL 5.2(L) 5.9(L)  Albumin 3.5 - 5.0 g/dL 2.1(L) 2.4(L)  AST 15 - 41 U/L 171(H) 212(H)  ALT 14 - 54 U/L 59(H) 70(H)  Alk Phosphatase 38 - 126 U/L 301(H) 356(H)  Total Bilirubin 0.3 - 1.2 mg/dL 4.1(H) 4.9(H)    Nutrition-Focused Physical Exam Findings: Nutrition-Focused physical exam completed. Findings are WDL for fat depletion, muscle depletion, and edema.    Weight Trend PTA: Pt reports her usual body weight years ago was 150-155lbs, however when her mother was undergoing chemotherapy she reports gaining up to 165lbs. Pt reports in the past 6-8 weeks weight loss of 25lbs.  (15% weight loss in 2 months)   Skin:  Reviewed, no issues (Jaundice noted)   Height:   Ht Readings from Last 1 Encounters:  07/18/15 5' 9"  (1.753 m)    Weight:   Wt Readings from Last 1 Encounters:  07/18/15 137 lb 6.4 oz (62.324 kg)    BMI:  Body mass index is 20.28 kg/(m^2).  Estimated Nutritional Needs:   Kcal:  1715-2027kcals  Protein:  62-74g protein  Fluid:  >1.8L fluid  EDUCATION NEEDS:   No education needs identified at this time  Dwyane Luo, RD, LDN Pager [phone] Weekend/On-Call Pager [phone]

## 2015-07-19 NOTE — Care Management (Signed)
Admitted to [facility] with the diagnosis of acute abdominal pain. Friend is Lori ([phone]). Last seen Dr. Daniel NonesBert Klein 07/18/15. Takes care of all basic and instrumental activities of daily living herself. GI recommending to advance diet. Now on Full liquids. Gwenette GreetBrenda S Brelynn Wheller RN MSN CCM Care Management [phone]

## 2015-07-19 NOTE — Discharge Summary (Addendum)
[facility] - [place] at [facility]   PATIENT NAME: Monique Mcfarland    MR#:  [id number]  DATE OF BIRTH:  23-Jun-1965  DATE OF ADMISSION:  07/18/2015 ADMITTING PHYSICIAN: Ramonita Lab, MD  DATE OF DISCHARGE: 07/19/15  PRIMARY CARE PHYSICIAN: Curtis Sites III, MD    ADMISSION DIAGNOSIS:  elevated liver enzymes  DISCHARGE DIAGNOSIS:  Elevated Liver enzymes due to STEATOHEPATITIS-out pt w/u H/o ETOH use HTN  SECONDARY DIAGNOSIS:   Past Medical History  Diagnosis Date  . Hypertension   . Atrial fibrillation [facility])     HOSPITAL COURSE:  Monique Mcfarland is a 50 y.o. female with a known history of essential hypertension, hyperlipidemia and paroxysmal atrial fibrillation on aspirin is presenting as a direct admit from primary care physician office with a chief complaint of intractable nausea and vomiting and lower abdominal discomfort associated with 25 pounds of weight loss in the past 6-8 weeks.   # Intractable nausea and vomiting with acute lower abdominal pain Doing well. Tolerating po diet (FLD) will advance to soft diet Received IV fluids  #Elevated LFTs with weight loss Hepatitis panel was checked by the PCP in July 2016 which was normal Right upper quadrant ultrasound has revealed hepatic steatosis MRCP negative for obstructive pathology.  Abstain from drinking Appreciated GI input  #Essential hypertension Resume BB  overall improving Electrolytes better. Pt advised MVI and po magnesium  CONSULTS OBTAINED:  Treatment Team:  Wallace Cullens, MD  DRUG ALLERGIES:   Allergies  Allergen Reactions  . Pollen Extract Other (See Comments)    Swelling of eyes, running nose, stretchy throat, pet dandruff    DISCHARGE MEDICATIONS:   Current Discharge Medication List    START taking these medications   Details  magnesium oxide (MAG-OX) 400 (241.3 Mg) MG tablet Take 1 tablet (400 mg total) by mouth 2 (two) times daily. Qty: 60 tablet,  Refills: 0    ondansetron (ZOFRAN-ODT) 4 MG disintegrating tablet Take 1 tablet (4 mg total) by mouth every 8 (eight) hours as needed for nausea or vomiting. Qty: 20 tablet, Refills: 0      CONTINUE these medications which have NOT CHANGED   Details  metoprolol tartrate (LOPRESSOR) 25 MG tablet Take 25 mg by mouth daily.    Multiple Vitamin (MULTIVITAMIN) tablet Take 1 tablet by mouth daily.    omeprazole (PRILOSEC) 10 MG capsule Take 10 mg by mouth daily.        If you experience worsening of your admission symptoms, develop shortness of breath, life threatening emergency, suicidal or homicidal thoughts you must seek medical attention immediately by calling 911 or calling your MD immediately  if symptoms less severe.  You Must read complete instructions/literature along with all the possible adverse reactions/side effects for all the Medicines you take and that have been prescribed to you. Take any new Medicines after you have completely understood and accept all the possible adverse reactions/side effects.   Please note  You were cared for by a hospitalist during your hospital stay. If you have any questions about your discharge medications or the care you received while you were in the hospital after you are discharged, you can call the unit and asked to speak with the hospitalist on call if the hospitalist that took care of you is not available. Once you are discharged, your primary care physician will handle any further medical issues. Please note that NO REFILLS for any discharge medications will be authorized once you are discharged,  as it is imperative that you return to your primary care physician (or establish a relationship with a primary care physician if you do not have one) for your aftercare needs so that they can reassess your need for medications and monitor your lab values. Today   SUBJECTIVE   Doing well. Eating lunch  VITAL SIGNS:  Blood pressure 97/64, pulse 97,  temperature 98.9 F (37.2 C), temperature source Oral, resp. rate 20, height  (1.753 m), weight 62.324 kg (137 lb 6.4 oz), SpO2 99 %.  I/O:    Intake/Output Summary (Last 24 hours) at 07/19/15 1322 Last data filed at 07/19/15 1308  Gross per 24 hour  Intake    290 ml  Output      2 ml  Net    288 ml    PHYSICAL EXAMINATION:  GENERAL:  50 y.o.-year-old patient lying in the bed with no acute distress.  EYES: Pupils equal, round, reactive to light and accommodation.mild scleral icterus. Extraocular muscles intact.  HEENT: Head atraumatic, normocephalic. Oropharynx and nasopharynx clear.  NECK:  Supple, no jugular venous distention. No thyroid enlargement, no tenderness.  LUNGS: Normal breath sounds bilaterally, no wheezing, rales,rhonchi or crepitation. No use of accessory muscles of respiration.  CARDIOVASCULAR: S1, S2 normal. No murmurs, rubs, or gallops.  ABDOMEN: Soft, non-tender, non-distended. Bowel sounds present. No organomegaly or mass.  EXTREMITIES: No pedal edema, cyanosis, or clubbing.  NEUROLOGIC: Cranial nerves II through XII are intact. Muscle strength 5/5 in all extremities. Sensation intact. Gait not checked.  PSYCHIATRIC: The patient is alert and oriented x 3.  SKIN: No obvious rash, lesion, or ulcer.   DATA REVIEW:   CBC   Recent Labs Lab 07/19/15 0609  WBC 12.0*  HGB 10.8*  HCT 30.9*  PLT 229    Chemistries   Recent Labs Lab 07/19/15 0609  NA 135  K 3.3*  CL 96*  CO2 32  GLUCOSE 122*  BUN <5*  CREATININE 0.66  CALCIUM 7.6*  MG 2.3  AST 171*  ALT 59*  ALKPHOS 301*  BILITOT 4.1*    Microbiology Results   No results found for this or any previous visit (from the past 240 hour(s)).  RADIOLOGY:  Mr Monique Mcfarland W/wo Cm/mrcp  07/18/2015  CLINICAL DATA:  25 pound weight loss over the past 2 months. Nausea and vomiting. Jaundice. Abnormal liver function tests. EXAM: MRI ABDOMEN WITHOUT AND WITH CONTRAST (INCLUDING MRCP) TECHNIQUE: Multiplanar  multisequence MR imaging of the abdomen was performed both before and after the administration of intravenous contrast. Heavily T2-weighted images of the biliary and pancreatic ducts were obtained, and three-dimensional MRCP images were rendered by post processing. CONTRAST:  15mL MULTIHANCE GADOBENATE DIMEGLUMINE 529 MG/ML IV SOLN COMPARISON:  Multiple exams, including 07/04/2015 FINDINGS: Despite efforts by the technologist and patient, motion artifact is present on today's exam and could not be eliminated. This reduces exam sensitivity and specificity. Lower chest:  Trace pleural effusions more notable on the right. Hepatobiliary: Periportal edema. Prominent diffuse hepatic steatosis with prominent dropout of signal throughout the hepatic parenchyma on out of phase images. On the images labeled "Immediate," Which would normally be in the early arterial phase, there is all ready portal venous and hepatic venous contrast, accordingly these are relief portal venous phase images and we lack arterial phase images. This can reduce sensitivity for certain lesion types. Although there is some heterogeneity of hepatic enhancement on this early portal venous phase series of images, this becomes less notable on later phases  the, and no worrisome focal lesion is identified. Intrahepatic bile ducts are indistinct and conceivably attenuated although some of this indistinctness may be related to motion artifact. The common hepatic duct and common bile duct are normal in caliber. Pancreas: Unremarkable Spleen: Unremarkable Adrenals/Urinary Tract: Unremarkable Stomach/Bowel: Unremarkable Vascular/Lymphatic: Unremarkable Other: Trace perihepatic ascites. Musculoskeletal: Lumbar spondylosis. IMPRESSION: 1. Prominent diffuse hepatic steatosis. I do not see a focal liver lesion. Sensitivity for some types of lesions is reduced due to difficulties with the contrast timing ; arterial phase images are not available. 2. Periportal edema  along with trace perihepatic ascites, along with trace right pleural effusion. The etiology is uncertain. 3. No biliary dilatation is identified. Intrahepatic bile ducts are indistinct but probably normal, stricturing of the intrahepatic bile ducts seems less likely. The MRCP images are adversely affected by motion artifact. 4. If further workup for the possibility of hepatocellular dysfunction as a cause for the patient's elevated bilirubin is warranted, considered nuclear medicine hepatobiliary scan. Electronically Signed   By: Gaylyn RongWalter  Liebkemann M.D.   On: 07/18/2015 19:58     Management plans discussed with the patient, family and they are in agreement.  CODE STATUS:     Code Status Orders        Start     Ordered   07/18/15 1414  Full code   Continuous     07/18/15 1413    Code Status History    Date Active Date Inactive Code Status Order ID Comments User Context   This patient has a current code status but no historical code status.      TOTAL TIME TAKING CARE OF THIS PATIENT: 40 minutes.    Avenir Lozinski M.D on 07/19/2015 at 1:22 PM  Between 7am to 6pm - Pager - [phone] After 6pm go to [email or web address] - password [email or web address]  [facility]  Office  [phone]  CC: Primary care physician; Lynnea FerrierBERT J KLEIN III, MD

## 2015-07-31 ENCOUNTER — Other Ambulatory Visit
Admission: RE | Admit: 2015-07-31 | Discharge: 2015-07-31 | Disposition: A | Discharge status: Home / Self Care | Payer: BC Managed Care – PPO | Source: Ambulatory Visit | Attending: Internal Medicine | Admitting: Internal Medicine

## 2015-07-31 DIAGNOSIS — R748 Abnormal levels of other serum enzymes: Secondary | ICD-10-CM | POA: Insufficient documentation

## 2015-07-31 LAB — AMMONIA: Ammonia: 35 umol/L (ref 9–35)

## 2015-08-29 ENCOUNTER — Other Ambulatory Visit: Payer: Self-pay | Admitting: Nurse Practitioner

## 2015-08-29 ENCOUNTER — Other Ambulatory Visit: Payer: Self-pay | Admitting: Internal Medicine

## 2015-08-29 DIAGNOSIS — K7460 Unspecified cirrhosis of liver: Secondary | ICD-10-CM

## 2015-08-29 DIAGNOSIS — R7989 Other specified abnormal findings of blood chemistry: Secondary | ICD-10-CM

## 2015-08-29 DIAGNOSIS — R945 Abnormal results of liver function studies: Principal | ICD-10-CM

## 2015-09-05 ENCOUNTER — Ambulatory Visit: Admission: RE | Admit: 2015-09-05 | Payer: BC Managed Care – PPO | Source: Ambulatory Visit

## 2017-06-14 IMAGING — US US ABDOMEN LIMITED
1 series · 14 of 25 positions shown · non-contrast
Comparison: None.

CLINICAL DATA: Elevated liver enzymes, asymptomatic.

EXAM:
US ABDOMEN LIMITED - RIGHT UPPER QUADRANT

[Series 1: us abdomen limited · 0.22mm/px · 14 of 52 slices shown]
[im 1/52]
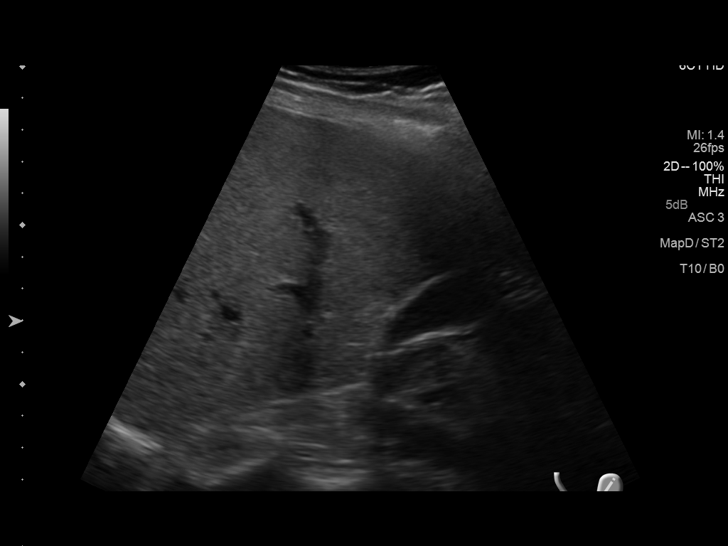
[im 5/52]
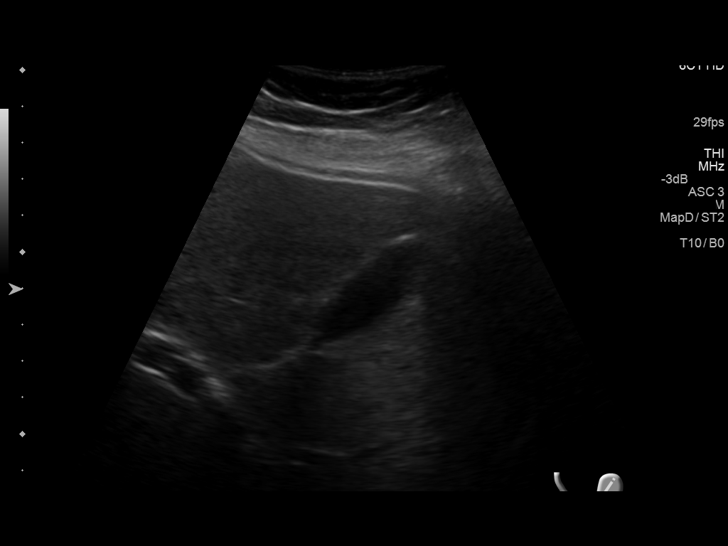
[im 9/52]
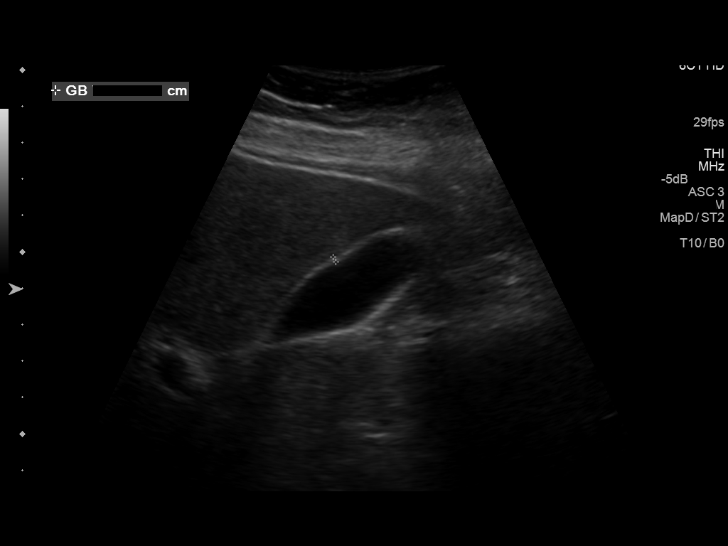
[im 13/52]
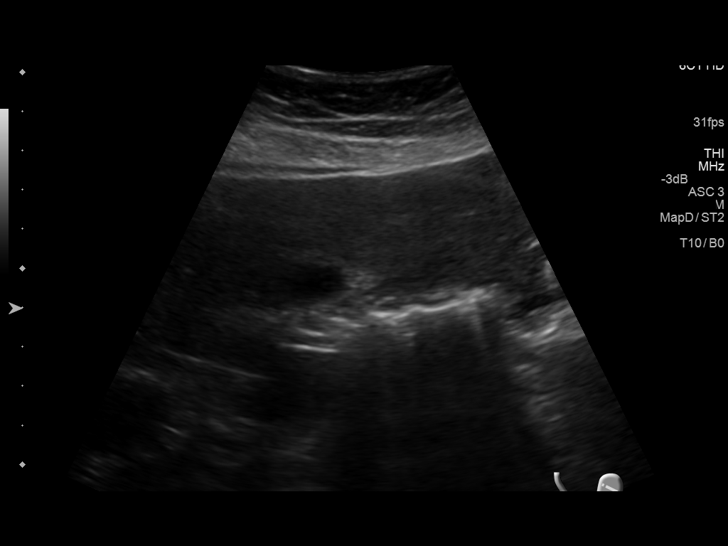
[im 18/52]
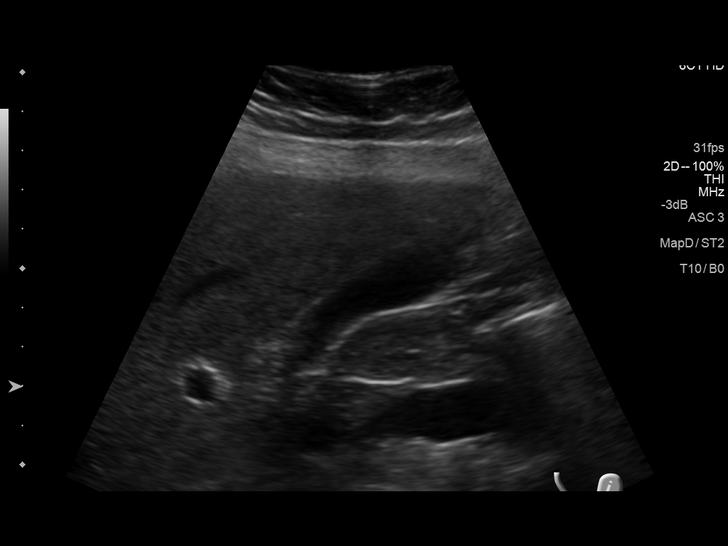
[im 20/52]
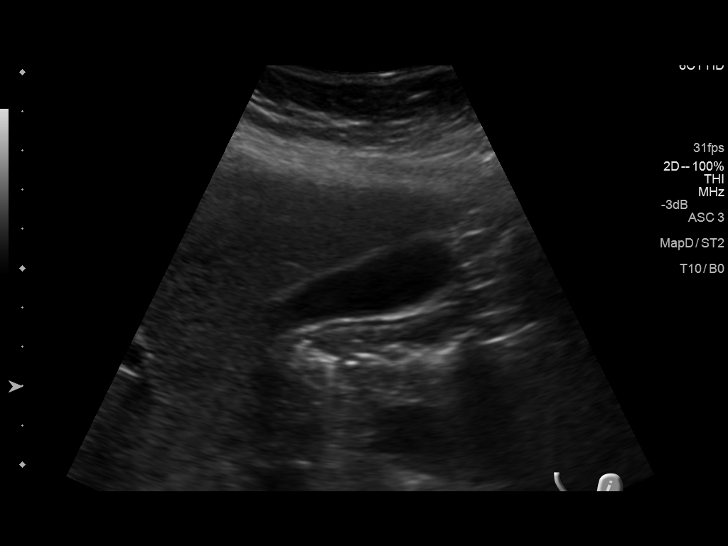
[im 24/52]
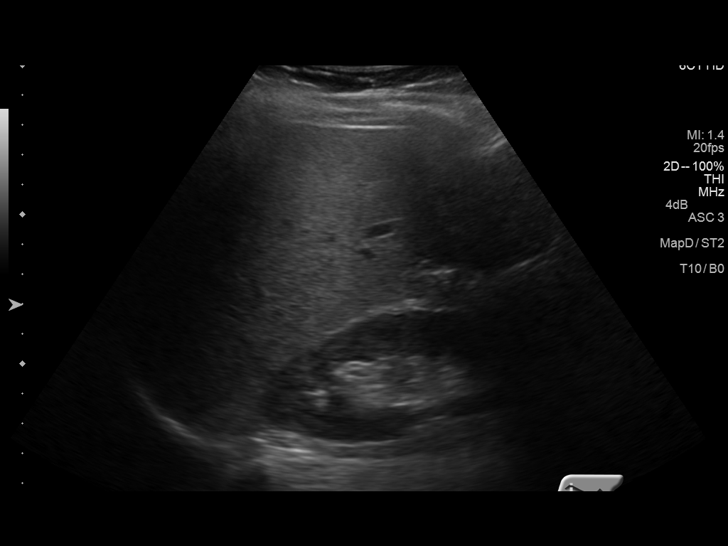
[im 28/52]
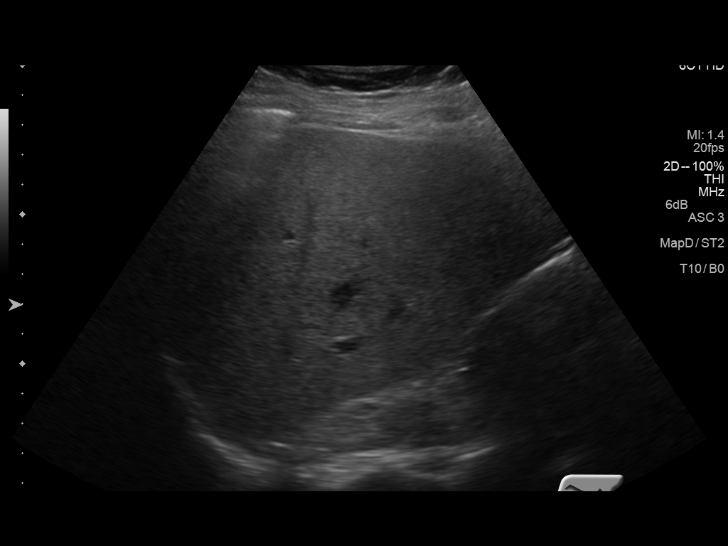
[im 32/52]
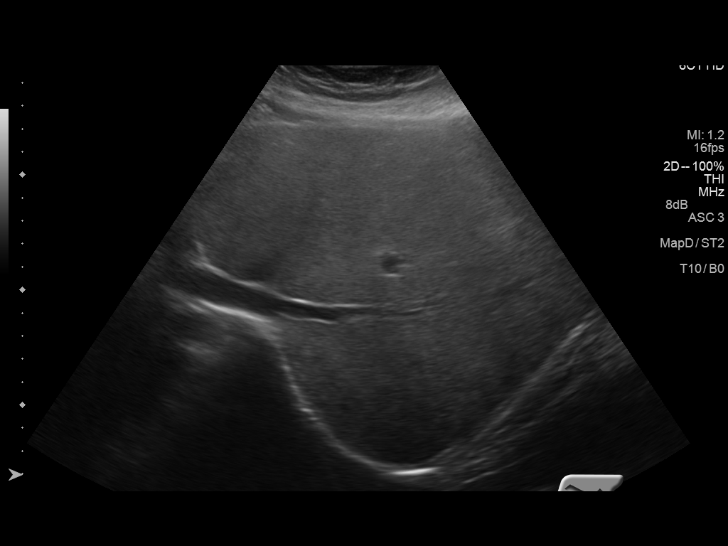
[im 35/52]
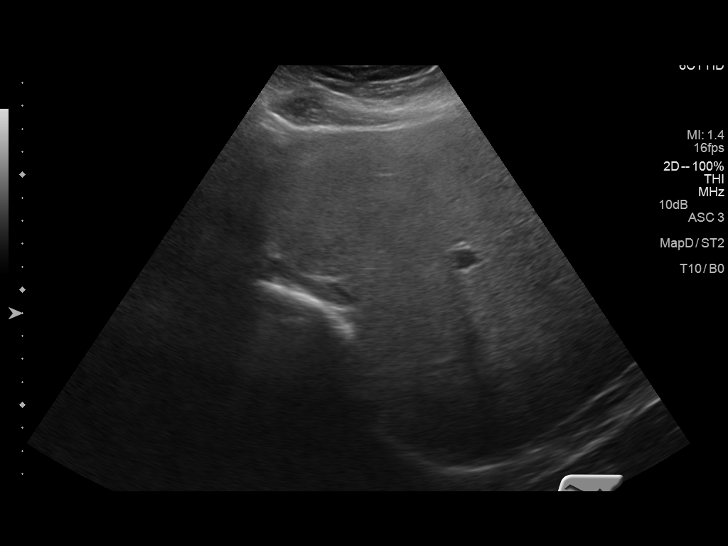
[im 39/52]
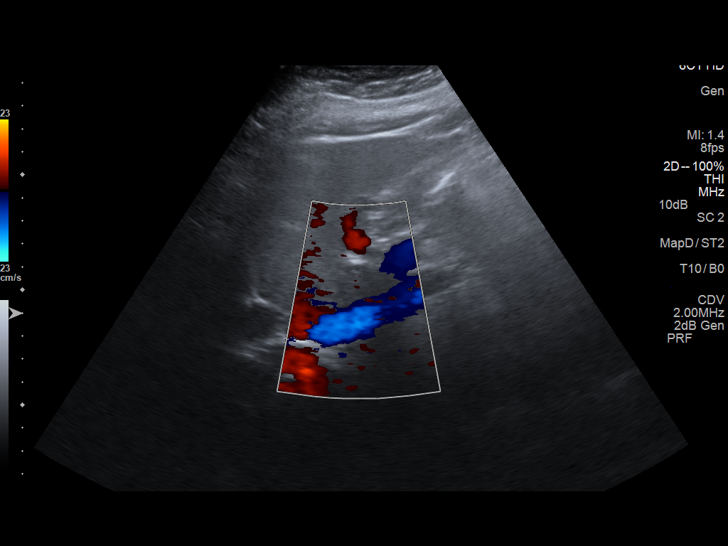
[im 43/52]
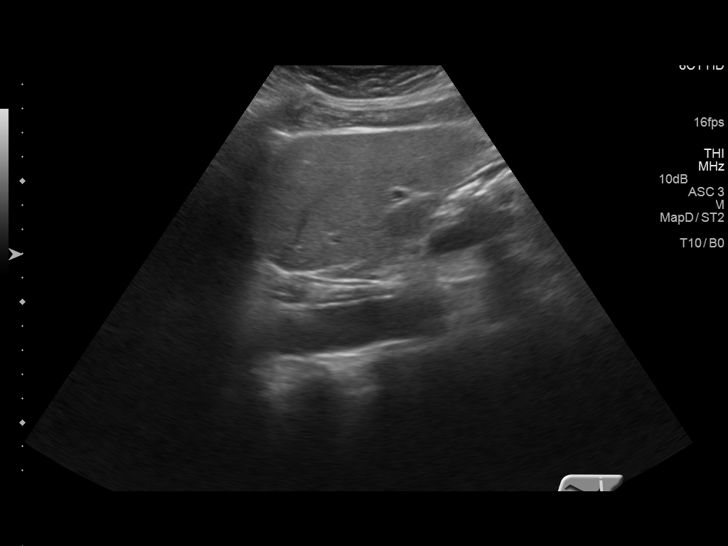
[im 47/52]
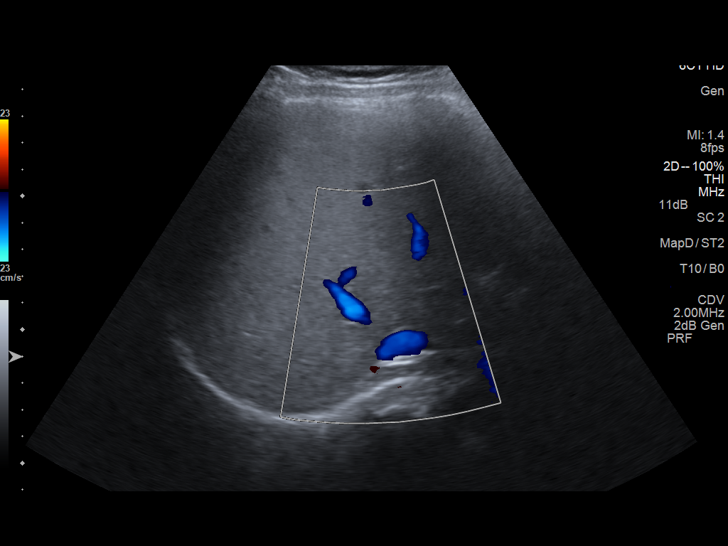
[im 52/52]
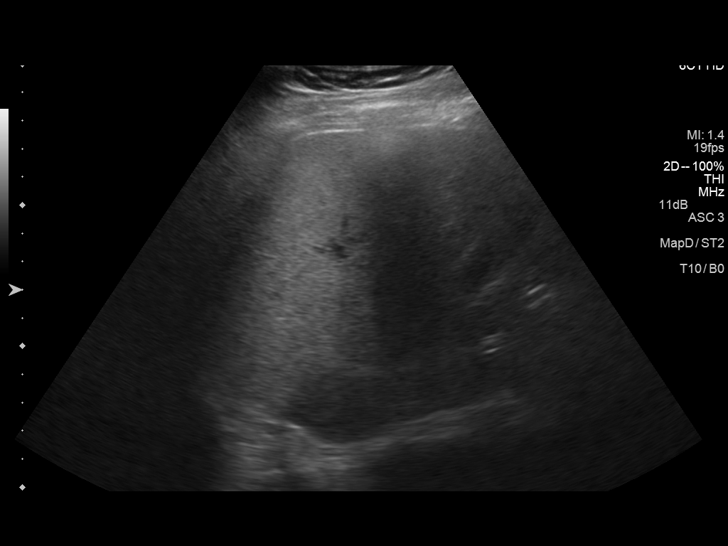

[14 of 25 positions shown; findings below may reference images not displayed]

FINDINGS: Gallbladder:

The gallbladder is adequately distended with no evidence of stones,
wall thickening, or pericholecystic fluid. There is no positive
sonographic Murphy's sign.

Common bile duct:

Diameter: 1.8 mm

Liver:

The hepatic echotexture is mildly increased diffusely. There is no
focal mass or ductal dilation.
IMPRESSION: Fatty infiltrative change of the liver. No acute hepatobiliary
abnormality is observed.

## 2017-08-28 DEATH — deceased

## 2018-02-17 IMAGING — US US ABDOMEN COMPLETE
1 series · 14 of 25 positions shown · non-contrast
Comparison: October 29, 2014

CLINICAL DATA: Elevated liver enzymes

EXAM:
ABDOMEN ULTRASOUND COMPLETE

[Series 1: us abdomen complete · 0.20mm/px · 14 of 93 slices shown]
[im 1/93]
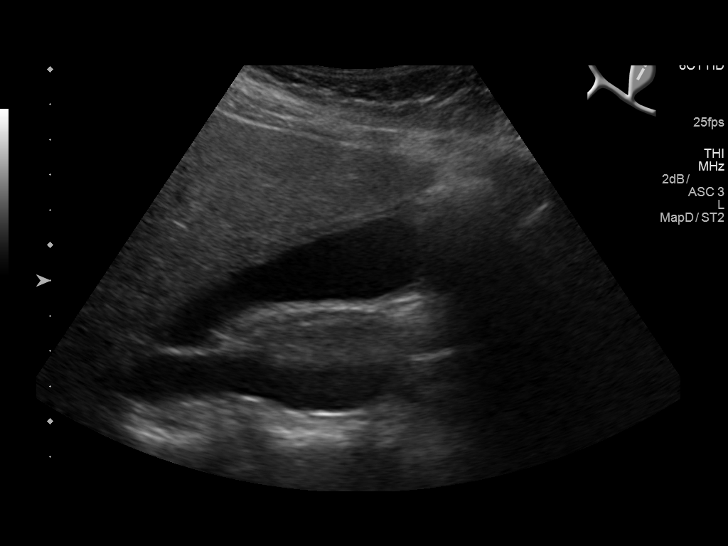
[im 8/93]
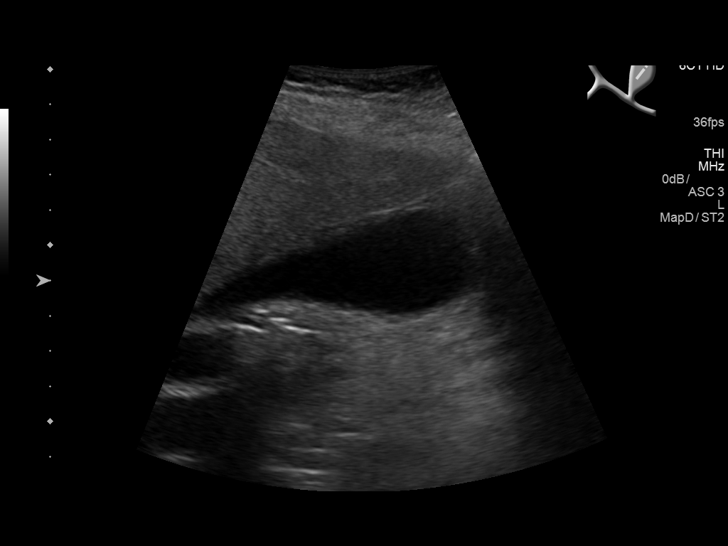
[im 16/93]
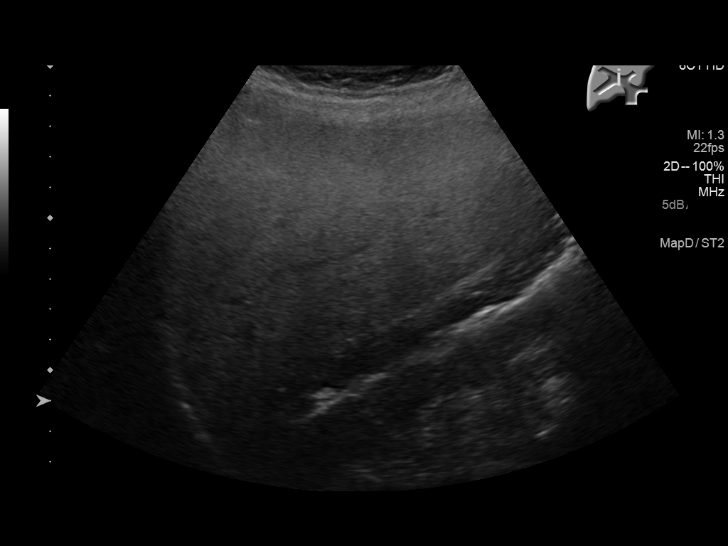
[im 24/93]
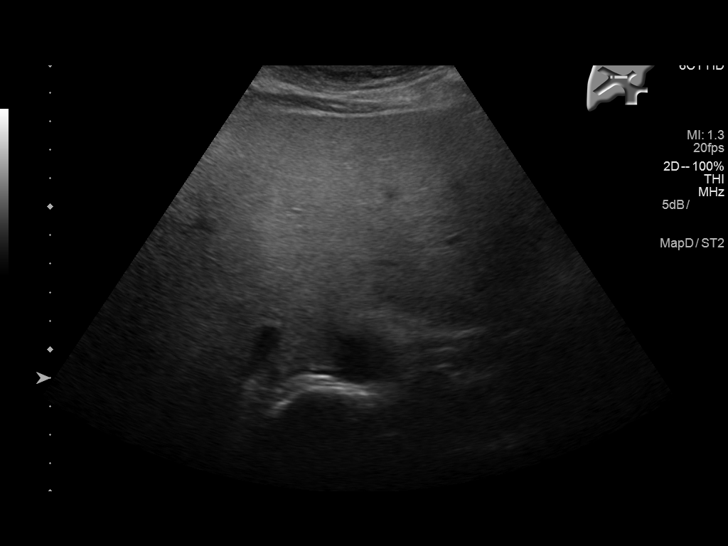
[im 31/93]
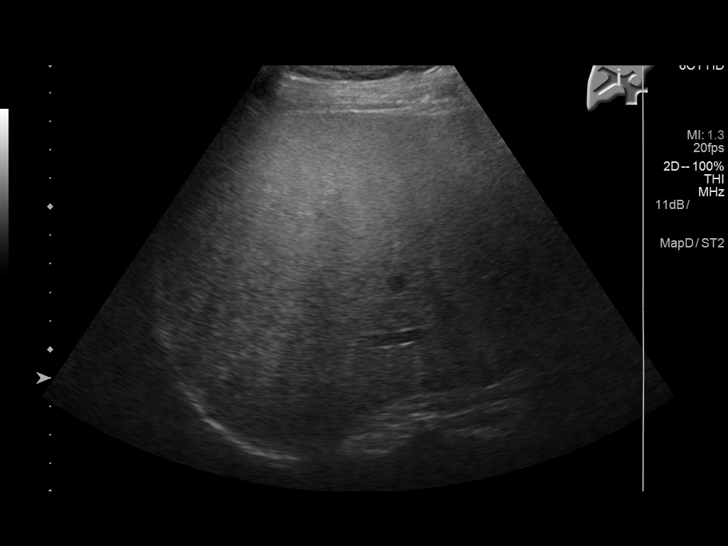
[im 35/93]
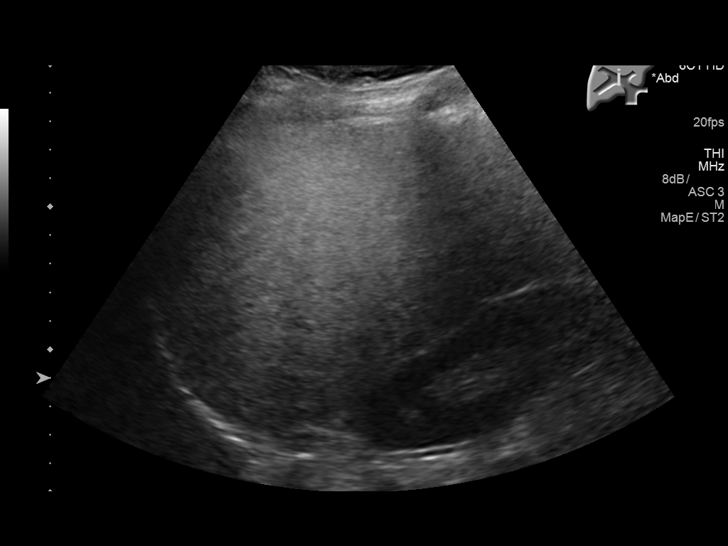
[im 43/93]
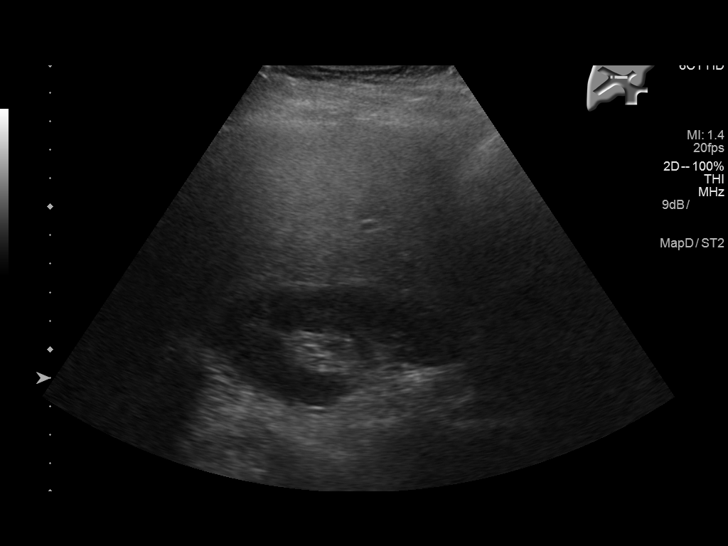
[im 50/93]
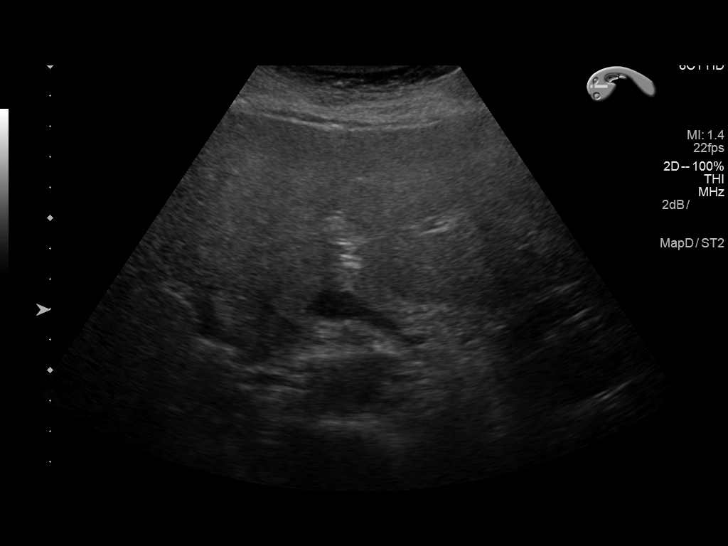
[im 58/93]
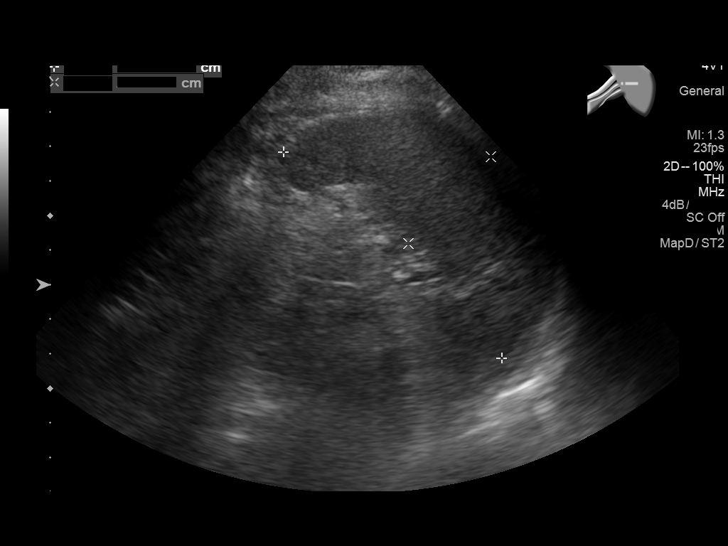
[im 62/93]
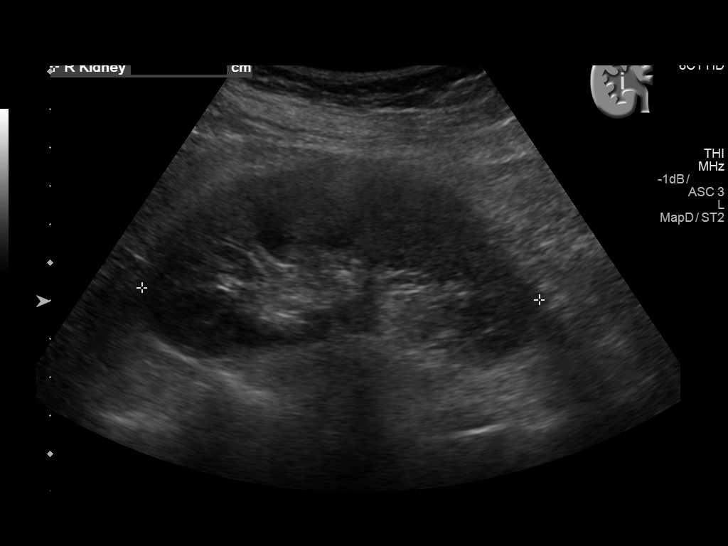
[im 70/93]
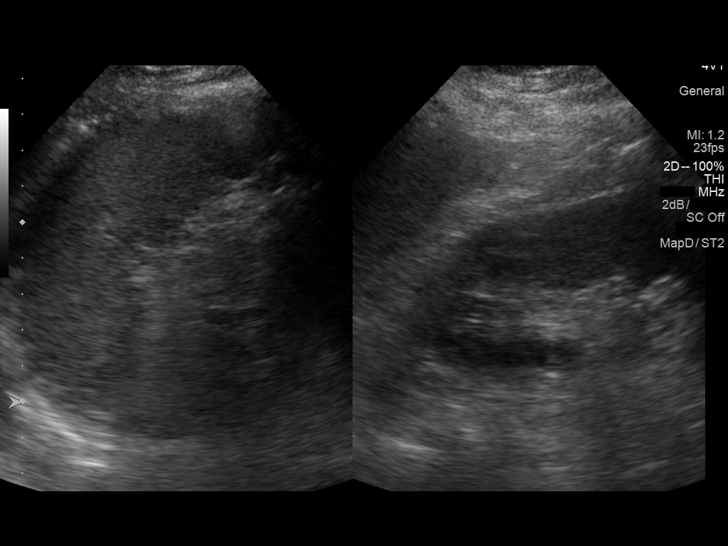
[im 77/93]
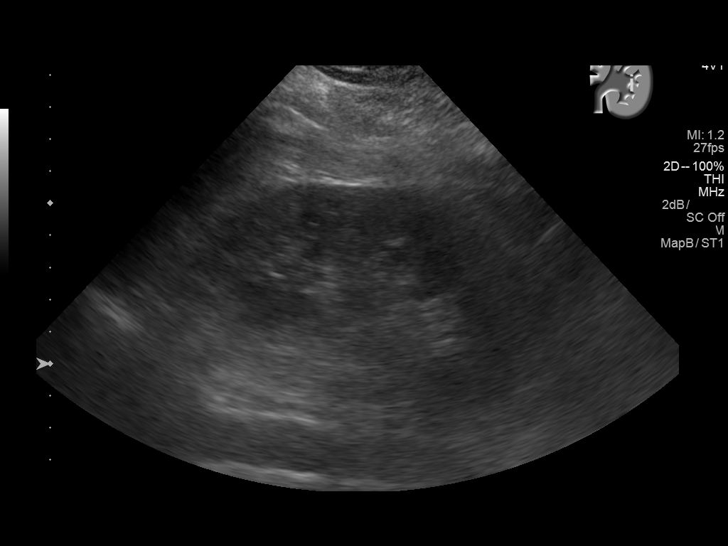
[im 85/93]
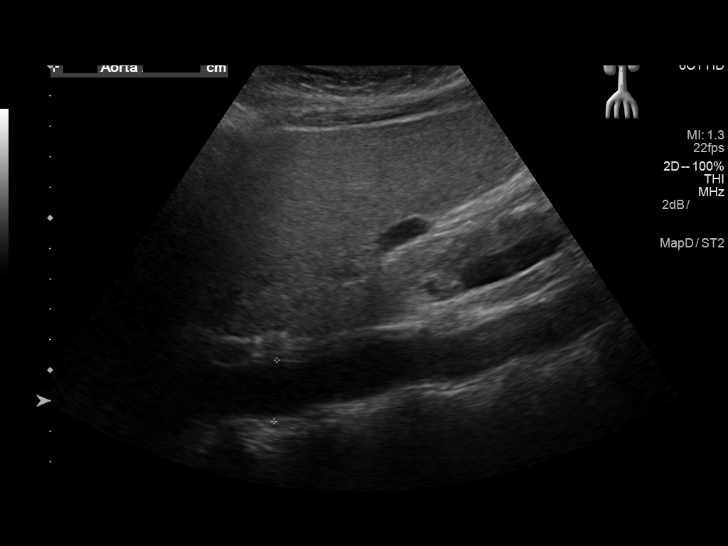
[im 93/93]
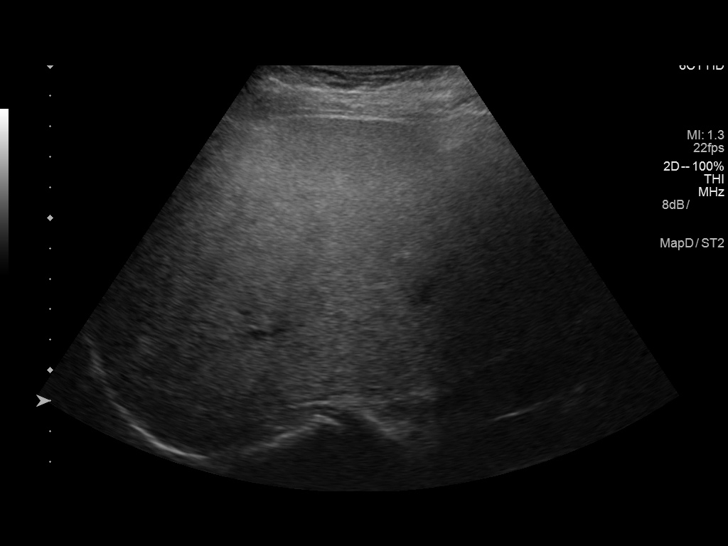

[14 of 25 positions shown; findings below may reference images not displayed]

FINDINGS: Gallbladder: No gallstones or wall thickening visualized. There is
no pericholecystic fluid. No sonographic Murphy sign noted by
sonographer.

Common bile duct: Diameter: 5 mm. There is no intrahepatic, common
hepatic, or common bile duct dilatation.

Liver: No focal lesion identified. Liver echogenicity overall is
increased.

IVC: No abnormality visualized.

Pancreas: No mass or inflammatory focus.

Spleen: Size and appearance within normal limits.

Right Kidney: Length: 10.4 cm. Echogenicity within normal limits. No
mass or hydronephrosis visualized.

Left Kidney: Length: 10.9 cm. Echogenicity within normal limits. No
mass or hydronephrosis visualized.

Abdominal aorta: No aneurysm visualized.

Other findings: No demonstrable ascites.
IMPRESSION: Increased liver echogenicity, a finding most likely indicative of
hepatic steatosis. While no focal liver lesions are identified, it
must be cautioned that the sensitivity of ultrasound for focal liver
lesions is diminished in this circumstance. Study otherwise
unremarkable.
